# Patient Record
Sex: Female | Born: 1945 | Race: White | Hispanic: No | Marital: Married | Smoking: Never smoker
Health system: Southern US, Community
[De-identification: ages and names within clinical notes are randomized; demographics above are authoritative.]

## PROBLEM LIST (undated history)

## (undated) DIAGNOSIS — I10 Essential (primary) hypertension: Secondary | ICD-10-CM

## (undated) HISTORY — DX: Essential (primary) hypertension: I10

---

## 1998-08-19 ENCOUNTER — Ambulatory Visit (HOSPITAL_BASED_OUTPATIENT_CLINIC_OR_DEPARTMENT_OTHER): Admission: RE | Admit: 1998-08-19 | Discharge: 1998-08-19 | Payer: Self-pay | Admitting: General Surgery

## 2004-11-03 ENCOUNTER — Other Ambulatory Visit: Admission: RE | Admit: 2004-11-03 | Discharge: 2004-11-03 | Payer: Self-pay | Admitting: *Deleted

## 2005-12-06 ENCOUNTER — Other Ambulatory Visit: Admission: RE | Admit: 2005-12-06 | Discharge: 2005-12-06 | Payer: Self-pay | Admitting: *Deleted

## 2006-12-14 ENCOUNTER — Other Ambulatory Visit: Admission: RE | Admit: 2006-12-14 | Discharge: 2006-12-14 | Payer: Self-pay | Admitting: Family Medicine

## 2008-07-21 ENCOUNTER — Other Ambulatory Visit: Admission: RE | Admit: 2008-07-21 | Discharge: 2008-07-21 | Payer: Self-pay | Admitting: Family Medicine

## 2009-09-09 ENCOUNTER — Other Ambulatory Visit: Admission: RE | Admit: 2009-09-09 | Discharge: 2009-09-09 | Payer: Self-pay | Admitting: Family Medicine

## 2010-09-12 ENCOUNTER — Other Ambulatory Visit: Payer: Self-pay | Admitting: Family Medicine

## 2010-09-12 ENCOUNTER — Other Ambulatory Visit (HOSPITAL_COMMUNITY)
Admission: RE | Admit: 2010-09-12 | Discharge: 2010-09-12 | Disposition: A | Discharge status: Home / Self Care | Payer: Medicare Other | Source: Ambulatory Visit | Attending: Family Medicine | Admitting: Family Medicine

## 2010-09-12 DIAGNOSIS — Z124 Encounter for screening for malignant neoplasm of cervix: Secondary | ICD-10-CM | POA: Insufficient documentation

## 2012-09-30 ENCOUNTER — Other Ambulatory Visit: Payer: Self-pay | Admitting: Family Medicine

## 2012-09-30 DIAGNOSIS — R109 Unspecified abdominal pain: Secondary | ICD-10-CM

## 2012-10-03 ENCOUNTER — Ambulatory Visit
Admission: RE | Admit: 2012-10-03 | Discharge: 2012-10-03 | Disposition: A | Discharge status: Home / Self Care | Payer: Medicare PPO | Source: Ambulatory Visit | Attending: Family Medicine | Admitting: Family Medicine

## 2012-10-03 DIAGNOSIS — R109 Unspecified abdominal pain: Secondary | ICD-10-CM

## 2013-10-06 ENCOUNTER — Encounter: Payer: Self-pay | Admitting: Podiatry

## 2013-10-06 ENCOUNTER — Ambulatory Visit (INDEPENDENT_AMBULATORY_CARE_PROVIDER_SITE_OTHER): Payer: Medicare PPO

## 2013-10-06 ENCOUNTER — Ambulatory Visit (INDEPENDENT_AMBULATORY_CARE_PROVIDER_SITE_OTHER): Payer: Medicare PPO | Admitting: Podiatry

## 2013-10-06 VITALS — BP 120/67 | HR 76 | Resp 18

## 2013-10-06 DIAGNOSIS — L923 Foreign body granuloma of the skin and subcutaneous tissue: Secondary | ICD-10-CM

## 2013-10-06 DIAGNOSIS — M722 Plantar fascial fibromatosis: Secondary | ICD-10-CM

## 2013-10-06 DIAGNOSIS — R52 Pain, unspecified: Secondary | ICD-10-CM

## 2013-10-06 MED ORDER — TRIAMCINOLONE ACETONIDE 10 MG/ML IJ SUSP
10.0000 mg | Freq: Once | INTRAMUSCULAR | Status: AC
Start: 2013-10-06 — End: 2013-10-06
  Administered 2013-10-06: 10 mg

## 2013-10-06 NOTE — Progress Notes (Signed)
° °  Subjective:    Patient ID: Kristen Johnson, female    DOB: 09/13/1945, 68 y.o.   MRN: [id number]  HPI about five weeks ago I stepped on a shoe tray and started hurting and shoots pain at night and maybe I bruised the muscle and does hurt with shoes and hurts from the instep to the top and stepped on some glass that my husband dropped on the right big toe and that happened about 3 weeks    Review of Systems  Allergic/Immunologic: Positive for environmental allergies.  All other systems reviewed and are negative.      Objective:   Physical Exam        Assessment & Plan:

## 2013-10-06 NOTE — Progress Notes (Signed)
Subjective:     Patient ID: Kristen Johnson, female   DOB: 04/23/1946, 68 y.o.   MRN: [id number]  HPI patient presents stating I stepped on a rate about 6 weeks ago and him having a lot of pain in the bottom of my arch and I'm also getting some changes around the big toe right might have stepped on something I'm not sure   Review of Systems  All other systems reviewed and are negative.      Objective:   Physical Exam  Nursing note and vitals reviewed. Constitutional: She is oriented to person, place, and time.  Cardiovascular: Intact distal pulses.   Musculoskeletal: Normal range of motion.  Neurological: She is oriented to person, place, and time.  Skin: Skin is warm.   neurovascular status intact with muscle strength adequate in range of motion the subtalar midtarsal joint within normal limits. Patient is found to have normal capsule time the digits and arch height and I noted the distal plantar fascia is inflamed around the insertion into the first metatarsal and there is a very slight area discoloration the right big toe with no proximal edema erythema or drainage noted     Assessment:     Probable traumatic plantar fasciitis right and possibility for foreign body right big toe    Plan:     H&P and x-rays reviewed today I injected the right plantar fascia 3 mg Kenalog 5 mg like Marcaine mixture and advised that if any changes should occur the big toe to let us know immediately. Reappoint 2 weeks earlier if necessary

## 2013-10-06 NOTE — Patient Instructions (Signed)

## 2014-09-26 DIAGNOSIS — J209 Acute bronchitis, unspecified: Secondary | ICD-10-CM | POA: Diagnosis not present

## 2014-10-14 DIAGNOSIS — H2513 Age-related nuclear cataract, bilateral: Secondary | ICD-10-CM | POA: Diagnosis not present

## 2014-10-14 DIAGNOSIS — H25041 Posterior subcapsular polar age-related cataract, right eye: Secondary | ICD-10-CM | POA: Diagnosis not present

## 2014-10-14 DIAGNOSIS — H43813 Vitreous degeneration, bilateral: Secondary | ICD-10-CM | POA: Diagnosis not present

## 2014-10-27 DIAGNOSIS — E2839 Other primary ovarian failure: Secondary | ICD-10-CM | POA: Diagnosis not present

## 2015-06-11 DIAGNOSIS — Z803 Family history of malignant neoplasm of breast: Secondary | ICD-10-CM | POA: Diagnosis not present

## 2015-06-11 DIAGNOSIS — Z1231 Encounter for screening mammogram for malignant neoplasm of breast: Secondary | ICD-10-CM | POA: Diagnosis not present

## 2017-12-14 ENCOUNTER — Encounter: Payer: Self-pay | Admitting: Podiatry

## 2017-12-14 ENCOUNTER — Ambulatory Visit (INDEPENDENT_AMBULATORY_CARE_PROVIDER_SITE_OTHER): Payer: Medicare Other | Admitting: Podiatry

## 2017-12-14 ENCOUNTER — Ambulatory Visit (INDEPENDENT_AMBULATORY_CARE_PROVIDER_SITE_OTHER): Payer: Medicare Other

## 2017-12-14 VITALS — BP 114/73 | HR 75 | Resp 16

## 2017-12-14 DIAGNOSIS — S9031XA Contusion of right foot, initial encounter: Secondary | ICD-10-CM | POA: Diagnosis not present

## 2017-12-14 DIAGNOSIS — S92501A Displaced unspecified fracture of right lesser toe(s), initial encounter for closed fracture: Secondary | ICD-10-CM

## 2017-12-14 NOTE — Progress Notes (Signed)
Subjective:   Patient ID: Kristen Johnson, female   DOB: 72 y.o.   MRN: [id number]   HPI Patient presents stating that she hit her foot on a piece of wood furniture and is been very sore and she is worried she broke something   ROS      Objective:  Physical Exam  Neurovascular status intact with patient's right foot showing swelling around the fourth digit and fourth metatarsal with pain within the digit itself     Assessment:  Probability for fracture of the fourth digit right with inflammatory process     Plan:  H&P condition reviewed and discussed fracture and healing.  This should heal uneventfully but I want her to use ice wider shoes and not wear anything constrictive.  Reappoint to recheck  X-ray indicates that the patient has a fracture of the base of the fourth digit right without displacement

## 2019-08-03 ENCOUNTER — Ambulatory Visit: Payer: Medicare PPO | Attending: Internal Medicine

## 2019-08-03 DIAGNOSIS — Z23 Encounter for immunization: Secondary | ICD-10-CM | POA: Insufficient documentation

## 2019-08-03 NOTE — Progress Notes (Signed)
   Covid-19 Vaccination Clinic  Name:  SAKIYAH SHUR    MRN: [id number] DOB: 1945-07-05  08/03/2019  Ms. Mcginnity was observed post Covid-19 immunization for 15 minutes without incidence. She was provided with Vaccine Information Sheet and instruction to access the V-Safe system.   Ms. Reifschneider was instructed to call 911 with any severe reactions post vaccine: Marland Kitchen Difficulty breathing  . Swelling of your face and throat  . A fast heartbeat  . A bad rash all over your body  . Dizziness and weakness    Immunizations Administered    Name Date Dose VIS Date Route   Pfizer COVID-19 Vaccine 08/03/2019  2:49 AM 0.3 mL 06/06/2019 Intramuscular   Manufacturer: [employer], [employer]   Lot: [id number]   NDC: [address]-7

## 2019-08-28 ENCOUNTER — Ambulatory Visit: Payer: Medicare PPO | Attending: Internal Medicine

## 2019-08-28 DIAGNOSIS — Z23 Encounter for immunization: Secondary | ICD-10-CM | POA: Insufficient documentation

## 2019-08-28 NOTE — Progress Notes (Signed)
   Covid-19 Vaccination Clinic  Name:  DAVANEE KLINKNER    MRN: [id number] DOB: [birth year]  08/28/2019  Ms. Cude was observed post Covid-19 immunization for 15 minutes without incident. She was provided with Vaccine Information Sheet and instruction to access the V-Safe system.   Ms. Mccollom was instructed to call 911 with any severe reactions post vaccine: Marland Kitchen Difficulty breathing  . Swelling of face and throat  . A fast heartbeat  . A bad rash all over body  . Dizziness and weakness   Immunizations Administered    Name Date Dose VIS Date Route   Pfizer COVID-19 Vaccine 08/28/2019 12:01 PM 0.3 mL 06/06/2019 Intramuscular   Manufacturer: [employer], [employer]   Lot: [id number]   NDC: [address]-4

## 2019-11-17 ENCOUNTER — Other Ambulatory Visit: Payer: Self-pay

## 2019-11-17 ENCOUNTER — Ambulatory Visit (INDEPENDENT_AMBULATORY_CARE_PROVIDER_SITE_OTHER): Payer: Medicare PPO

## 2019-11-17 ENCOUNTER — Ambulatory Visit (INDEPENDENT_AMBULATORY_CARE_PROVIDER_SITE_OTHER): Payer: Medicare PPO | Admitting: Orthopaedic Surgery

## 2019-11-17 VITALS — Ht 62.0 in | Wt 190.0 lb

## 2019-11-17 DIAGNOSIS — M4807 Spinal stenosis, lumbosacral region: Secondary | ICD-10-CM

## 2019-11-17 DIAGNOSIS — M79604 Pain in right leg: Secondary | ICD-10-CM

## 2019-11-17 NOTE — Progress Notes (Signed)
Office Visit Note   Patient: Kristen Johnson           Date of Birth: 13-Dec-1945           MRN: [id number] Visit Date: 11/17/2019              Requested by: Clayborn Heron, MD [address] [place],  [place] [address] PCP: Clayborn Heron, MD   Assessment & Plan: Visit Diagnoses:  1. Pain in right leg     Plan: The prior orthopedic office never MRI her lumbar spine.  I believe that is reasonable at this point given the failed conservative treatment and this can help target were best to try an intervention such as a steroid injection or even at some point radiofrequency ablation.  She agrees with this treatment plan.  I will have her try Voltaren gel in the interim while we await the results of the MRI of her lumbar spine.  All questions and concerns were answered and addressed.  Follow-Up Instructions: Return in about 2 weeks (around 12/01/2019).   Orders:  Orders Placed This Encounter  Procedures  . XR HIP UNILAT W OR W/O PELVIS 1V RIGHT  . XR Lumbar Spine 2-3 Views   No orders of the defined types were placed in this encounter.     Procedures: No procedures performed   Clinical Data: No additional findings.   Subjective: Chief Complaint  Patient presents with  . Right Hip - Pain  The patient is someone I am seeing for the first time but she has had an extensive orthopedic work-up before.  She comes in with right-sided low back pain and hip pain.  She denies any pain in the groin.  She denies any radicular symptoms going down her leg.  She is apparently had an intervention before and her spine in terms of an injection in her hip.  She was told she had hip bursitis and she has spine issues.  She does state that they did an intra-articular steroid injection of her right hip but they did this as a mistake and let her know that.  She has lost trust in that group because of this.  Her pain is daily and it affects her mainly at night.  She has trouble getting good rest  at night due to pain in her lower lumbar spine to the right side.  She is someone with a BMI of 35.  HPI  Review of Systems She currently denies any headache, chest pain, shortness of breath, fever, chills, nausea, vomiting  Objective: Vital Signs: Ht 5\' 2"  (1.575 m)   Wt 190 lb (86.2 kg)   BMI 34.75 kg/m   Physical Exam She is alert and orient x3 and in no acute distress Ortho Exam Examination of both her hips shows fluid and full range of motion of both hips with no pain in the groin at all.  She does have some slight pain to palpation over the right trochanteric area.  Examination of her lumbar spine shows severe pain in the paraspinal muscles and around the facet joints mainly to the right side at the lower lumbar spine.  She has pretty good flexion extension of her lumbar spine but is painful with extension. Specialty Comments:  No specialty comments available.  Imaging: XR HIP UNILAT W OR W/O PELVIS 1V RIGHT  Result Date: 11/17/2019 An AP pelvis and lateral the right hip shows normal-appearing hip joints bilaterally.  There are no acute findings.  The joint space is well-maintained with no significant arthritic findings at all.  XR Lumbar Spine 2-3 Views  Result Date: 11/17/2019 2 views of the lumbar spine show no acute findings.  There is a grade 1 anterolisthesis of L4 and L5.  There are arthritic changes at several levels.    PMFS History: There are no problems to display for this patient.  Past Medical History:  Diagnosis Date  . Hypertension     No family history on file.  No past surgical history on file. Social History   Occupational History  . Not on file  Tobacco Use  . Smoking status: Never Smoker  . Smokeless tobacco: Never Used  Substance and Sexual Activity  . Alcohol use: No  . Drug use: No  . Sexual activity: Not on file

## 2019-11-18 ENCOUNTER — Encounter: Payer: Self-pay | Admitting: Orthopaedic Surgery

## 2019-12-03 ENCOUNTER — Ambulatory Visit: Payer: Medicare PPO | Admitting: Orthopaedic Surgery

## 2019-12-10 DIAGNOSIS — Z1231 Encounter for screening mammogram for malignant neoplasm of breast: Secondary | ICD-10-CM | POA: Diagnosis not present

## 2019-12-15 ENCOUNTER — Ambulatory Visit
Admission: RE | Admit: 2019-12-15 | Discharge: 2019-12-15 | Disposition: A | Discharge status: Home / Self Care | Payer: Medicare PPO | Source: Ambulatory Visit | Attending: Orthopaedic Surgery | Admitting: Orthopaedic Surgery

## 2019-12-15 DIAGNOSIS — M4807 Spinal stenosis, lumbosacral region: Secondary | ICD-10-CM

## 2019-12-15 DIAGNOSIS — M48061 Spinal stenosis, lumbar region without neurogenic claudication: Secondary | ICD-10-CM | POA: Diagnosis not present

## 2019-12-17 ENCOUNTER — Other Ambulatory Visit: Payer: Self-pay

## 2019-12-17 ENCOUNTER — Ambulatory Visit (INDEPENDENT_AMBULATORY_CARE_PROVIDER_SITE_OTHER): Payer: Medicare PPO | Admitting: Physician Assistant

## 2019-12-17 ENCOUNTER — Encounter: Payer: Self-pay | Admitting: Orthopaedic Surgery

## 2019-12-17 DIAGNOSIS — M4807 Spinal stenosis, lumbosacral region: Secondary | ICD-10-CM | POA: Diagnosis not present

## 2019-12-17 NOTE — Progress Notes (Signed)
   Office Visit Note   Patient: Kristen Johnson           Date of Birth: Aug 12, 1945           MRN: [id number] Visit Date: 12/17/2019              Requested by: Clayborn Heron, MD [address] [place],  [place] [address] PCP: Clayborn Heron, MD   Assessment & Plan: Visit Diagnoses:  1. Spinal stenosis of lumbosacral region     Plan: We will refer her to Dr. Alvester Morin for lumbar epidural steroid injection. See her back 4 weeks after the  injection to see what type of response she had. Questions were encouraged and answered.  Follow-Up Instructions: Return 4weeks post ESI.   Orders:  No orders of the defined types were placed in this encounter.  No orders of the defined types were placed in this encounter.     Procedures: No procedures performed   Clinical Data: No additional findings.   Subjective: Chief Complaint  Patient presents with  . Lower Back - Follow-up    HPI [name] returns today follow-up of her MRI. She continues to have low back pain particularly on the right side. Occasional achy pain lower lateral left leg. She denies any saddle anesthesia bowel or bladder dysfunction. Does have awakening pain when she rolls over on the right side. MRI images are reviewed with the patient the lumbar spine model was also used for visualization purposes. MRI dated 12/16/2019 showed multifactorial L3-4 spinal stenosis which is moderate. Also at L3-4 encroachment upon the right L3 nerve root due to the broad-based foraminal disc protrusion. Mild multifactorial stenosis at L4-5. Foraminal stenosis at L2-3 mild. Moderate facet hypertrophy at L3-4 and L4-5.  Review of Systems See HPI   Objective: Vital Signs: There were no vitals taken for this visit.  Physical Exam Constitutional:      Appearance: She is not ill-appearing or diaphoretic.  Pulmonary:     Effort: Pulmonary effort is normal.  Neurological:     Mental Status: She is alert and oriented to person,  place, and time.  Psychiatric:        Mood and Affect: Mood normal.     Ortho Exam negative straight leg raise bilaterally. Good range of motion both hips. Minimal tenderness over the right trochanteric region.  Specialty Comments:  No specialty comments available.  Imaging: No results found.   PMFS History: There are no problems to display for this patient.  Past Medical History:  Diagnosis Date  . Hypertension     No family history on file.  No past surgical history on file. Social History   Occupational History  . Not on file  Tobacco Use  . Smoking status: Never Smoker  . Smokeless tobacco: Never Used  Substance and Sexual Activity  . Alcohol use: No  . Drug use: No  . Sexual activity: Not on file

## 2019-12-24 ENCOUNTER — Other Ambulatory Visit: Payer: Self-pay | Admitting: Radiology

## 2019-12-24 DIAGNOSIS — M79604 Pain in right leg: Secondary | ICD-10-CM

## 2019-12-24 DIAGNOSIS — M4807 Spinal stenosis, lumbosacral region: Secondary | ICD-10-CM

## 2019-12-25 ENCOUNTER — Encounter: Payer: Self-pay | Admitting: Physical Medicine and Rehabilitation

## 2020-01-01 DIAGNOSIS — L719 Rosacea, unspecified: Secondary | ICD-10-CM | POA: Diagnosis not present

## 2020-01-01 DIAGNOSIS — L578 Other skin changes due to chronic exposure to nonionizing radiation: Secondary | ICD-10-CM | POA: Diagnosis not present

## 2020-01-01 DIAGNOSIS — L821 Other seborrheic keratosis: Secondary | ICD-10-CM | POA: Diagnosis not present

## 2020-01-01 DIAGNOSIS — D235 Other benign neoplasm of skin of trunk: Secondary | ICD-10-CM | POA: Diagnosis not present

## 2020-01-01 DIAGNOSIS — L905 Scar conditions and fibrosis of skin: Secondary | ICD-10-CM | POA: Diagnosis not present

## 2020-01-01 DIAGNOSIS — L814 Other melanin hyperpigmentation: Secondary | ICD-10-CM | POA: Diagnosis not present

## 2020-01-01 DIAGNOSIS — L570 Actinic keratosis: Secondary | ICD-10-CM | POA: Diagnosis not present

## 2020-01-01 DIAGNOSIS — D1801 Hemangioma of skin and subcutaneous tissue: Secondary | ICD-10-CM | POA: Diagnosis not present

## 2020-01-12 ENCOUNTER — Encounter: Payer: Self-pay | Admitting: Physical Medicine and Rehabilitation

## 2020-01-12 ENCOUNTER — Ambulatory Visit (INDEPENDENT_AMBULATORY_CARE_PROVIDER_SITE_OTHER): Payer: Medicare PPO | Admitting: Physical Medicine and Rehabilitation

## 2020-01-12 ENCOUNTER — Other Ambulatory Visit: Payer: Self-pay

## 2020-01-12 ENCOUNTER — Ambulatory Visit: Payer: Self-pay

## 2020-01-12 VITALS — BP 118/71 | HR 89

## 2020-01-12 DIAGNOSIS — M5416 Radiculopathy, lumbar region: Secondary | ICD-10-CM

## 2020-01-12 DIAGNOSIS — M48062 Spinal stenosis, lumbar region with neurogenic claudication: Secondary | ICD-10-CM

## 2020-01-12 MED ORDER — METHYLPREDNISOLONE ACETATE 80 MG/ML IJ SUSP
80.0000 mg | Freq: Once | INTRAMUSCULAR | Status: AC
Start: 2020-01-12 — End: 2020-01-12
  Administered 2020-01-12: 80 mg

## 2020-01-12 NOTE — Progress Notes (Signed)
Pt states right lower back pain. Pt states at night when trying to sleep and activity makes thr pain worse. Pt state pain meds makes it feel better.   Numeric Pain Rating Scale and Functional Assessment Average Pain 5   In the last MONTH (on 0-10 scale) has pain interfered with the following?  1. General activity like being  able to carry out your everyday physical activities such as walking, climbing stairs, carrying groceries, or moving a chair?  Rating(7)   +Driver, -BT, -Dye Allergies.

## 2020-01-22 ENCOUNTER — Ambulatory Visit: Payer: Medicare PPO | Admitting: Orthopaedic Surgery

## 2020-01-26 ENCOUNTER — Encounter: Payer: Medicare PPO | Admitting: Physical Medicine and Rehabilitation

## 2020-02-09 ENCOUNTER — Ambulatory Visit (INDEPENDENT_AMBULATORY_CARE_PROVIDER_SITE_OTHER): Payer: Medicare PPO | Admitting: Orthopaedic Surgery

## 2020-02-09 ENCOUNTER — Encounter: Payer: Self-pay | Admitting: Orthopaedic Surgery

## 2020-02-09 DIAGNOSIS — M4807 Spinal stenosis, lumbosacral region: Secondary | ICD-10-CM

## 2020-02-09 DIAGNOSIS — M79604 Pain in right leg: Secondary | ICD-10-CM | POA: Diagnosis not present

## 2020-02-09 NOTE — Progress Notes (Signed)
Kristen Johnson - 74 y.o. female MRN [id number]  Date of birth: 03-20-1946  Office Visit Note: Visit Date: 01/12/2020 PCP: Clayborn Heron, MD Referred by: Clayborn Heron, MD  Subjective: Chief Complaint  Patient presents with  . Lower Back - Pain   HPI:  Kristen Johnson is a 74 y.o. female who comes in today at the request of Rexene Edison, PA-C for planned Bilateral L3-L4 Lumbar epidural steroid injection with fluoroscopic guidance.  The patient has failed conservative care including home exercise, medications, time and activity modification.  This injection will be diagnostic and hopefully therapeutic.  Please see requesting physician notes for further details and justification.  MRI reviewed with images and spine model.  MRI reviewed in the note below.    ROS Otherwise per HPI.  Assessment & Plan: Visit Diagnoses:  1. Lumbar radiculopathy   2. Spinal stenosis of lumbar region with neurogenic claudication     Plan: No additional findings.   Meds & Orders:  Meds ordered this encounter  Medications  . methylPREDNISolone acetate (DEPO-MEDROL) injection 80 mg    Orders Placed This Encounter  Procedures  . XR C-ARM NO REPORT  . Epidural Steroid injection    Follow-up: Return for visit to requesting physician as needed.   Procedures: No procedures performed  Lumbosacral Transforaminal Epidural Steroid Injection - Sub-Pedicular Approach with Fluoroscopic Guidance  Patient: Kristen Johnson      Date of Birth: 08-01-1945 MRN: [id number] PCP: Clayborn Heron, MD      Visit Date: 01/12/2020   Universal Protocol:    Date/Time: 01/12/2020  Consent Given By: the patient  Position: PRONE  Additional Comments: Vital signs were monitored before and after the procedure. Patient was prepped and draped in the usual sterile fashion. The correct patient, procedure, and site was verified.   Injection Procedure Details:  Procedure Site One Meds Administered:  Meds ordered  this encounter  Medications  . methylPREDNISolone acetate (DEPO-MEDROL) injection 80 mg    Laterality: Bilateral  Location/Site:  L3-L4  Needle size: 22 G  Needle type: Spinal  Needle Placement: Transforaminal  Findings:    -Comments: Excellent flow of contrast along the nerve, nerve root and into the epidural space.  Procedure Details: After squaring off the end-plates to get a true AP view, the C-arm was positioned so that an oblique view of the foramen as noted above was visualized. The target area is just inferior to the "nose of the scotty dog" or sub pedicular. The soft tissues overlying this structure were infiltrated with 2-3 ml. of 1% Lidocaine without Epinephrine.  The spinal needle was inserted toward the target using a "trajectory" view along the fluoroscope beam.  Under AP and lateral visualization, the needle was advanced so it did not puncture dura and was located close the 6 O'Clock position of the pedical in AP tracterory. Biplanar projections were used to confirm position. Aspiration was confirmed to be negative for CSF and/or blood. A 1-2 ml. volume of Isovue-250 was injected and flow of contrast was noted at each level. Radiographs were obtained for documentation purposes.   After attaining the desired flow of contrast documented above, a 0.5 to 1.0 ml test dose of 0.25% Marcaine was injected into each respective transforaminal space.  The patient was observed for 90 seconds post injection.  After no sensory deficits were reported, and normal lower extremity motor function was noted,   the above injectate was administered so that equal amounts of the injectate  were placed at each foramen (level) into the transforaminal epidural space.   Additional Comments:  The patient tolerated the procedure well Dressing: 2 x 2 sterile gauze and Band-Aid    Post-procedure details: Patient was observed during the procedure. Post-procedure instructions were reviewed.  Patient  left the clinic in stable condition.      Clinical History: MRI LUMBAR SPINE WITHOUT CONTRAST  TECHNIQUE: Multiplanar, multisequence MR imaging of the lumbar spine was performed. No intravenous contrast was administered.  COMPARISON:  Radiographs 11/17/2019.  FINDINGS: Segmentation: Conventional anatomy assumed, with the last open disc space designated L5-S1.Concordant with prior radiographs.  Alignment: Degenerative grade 1 anterolisthesis at L4-5 and a mild convex left scoliosis.  Vertebrae: No worrisome osseous lesion, acute fracture or pars defect. Prominent endplate degenerative changes at L3-4, asymmetric to the right. The visualized sacroiliac joints appear unremarkable.  Conus medullaris: Extends to the L1-2 level and appears normal.  Paraspinal and other soft tissues: No significant paraspinal findings.  Disc levels:  Sagittal images demonstrate mild disc bulging at T11-12 without cord deformity. The left foramen appears mildly narrowed.  Disc height and hydration are maintained at T12-L1 and L1-2.  L2-3: Mild disc bulging, facet and ligamentous hypertrophy. No significant resulting central canal stenosis. Mild narrowing of the left lateral recess and both foramina.  L3-4: Loss of disc height with annular disc bulging and a broad-based right foraminal disc protrusion. Moderate facet and ligamentous hypertrophy. These factors contribute to moderate spinal stenosis with narrowing of both lateral recesses. In addition, there is right greater than left foraminal narrowing with probable right L3 nerve root encroachment.  L4-5: Mild loss of disc height with disc bulging and moderate facet hypertrophy. Resulting mild spinal stenosis with mild narrowing of the lateral recesses and foramina.  L5-S1: Disc height and hydration are maintained. Mild bilateral facet hypertrophy without resulting spinal stenosis or nerve  root encroachment.  IMPRESSION: 1. Moderate multifactorial spinal stenosis at L3-4 with narrowing of both lateral recesses and right greater than left foraminal narrowing. There is probable right L3 nerve root encroachment by a broad-based foraminal disc protrusion. 2. Mild multifactorial spinal stenosis at L4-5 with mild narrowing of the lateral recesses and foramina. 3. Mild narrowing of the left lateral recess and both foramina at L2-3.   Electronically Signed   By: Carey Bullocks M.D.   On: 12/16/2019 15:18     Objective:  VS:  HT:    WT:   BMI:     BP:118/71  HR:89bpm  TEMP: ( )  RESP:  Physical Exam Constitutional:      General: She is not in acute distress.    Appearance: Normal appearance. She is not ill-appearing.  HENT:     Head: Normocephalic and atraumatic.     Right Ear: External ear normal.     Left Ear: External ear normal.  Eyes:     Extraocular Movements: Extraocular movements intact.  Cardiovascular:     Rate and Rhythm: Normal rate.     Pulses: Normal pulses.  Musculoskeletal:     Right lower leg: No edema.     Left lower leg: No edema.     Comments: Patient has good distal strength with no pain over the greater trochanters.  No clonus or focal weakness.  Skin:    Findings: No erythema, lesion or rash.  Neurological:     General: No focal deficit present.     Mental Status: She is alert and oriented to person, place, and time.  Sensory: No sensory deficit.     Motor: No weakness or abnormal muscle tone.     Coordination: Coordination normal.  Psychiatric:        Mood and Affect: Mood normal.        Behavior: Behavior normal.      Imaging: No results found.

## 2020-02-09 NOTE — Progress Notes (Signed)
Patient comes in for follow-up after having bilateral L3-L4 epidural steroid injections by Dr. Alvester Morin.  This was done about 4 weeks ago.  She says she feels about 85% better.  She says anti-inflammatories and stretching is helped quite a bit.  She is through with the major pain she states.  She is ambulating without assistive device.  She looks comfortable overall.  She has negative straight leg raise bilaterally with excellent strength in her lower extremities and no significant radicular symptoms.  At this point follow-up can be as needed.  Certainly if she has a breakthrough of pain she can always give [place] a call to be set up for injections again.  All questions and concerns were answered and addressed.

## 2020-02-09 NOTE — Procedures (Signed)
Lumbosacral Transforaminal Epidural Steroid Injection - Sub-Pedicular Approach with Fluoroscopic Guidance  Patient: Kristen Johnson      Date of Birth: Aug 23, 1945 MRN: [id number] PCP: Clayborn Heron, MD      Visit Date: 01/12/2020   Universal Protocol:    Date/Time: 01/12/2020  Consent Given By: the patient  Position: PRONE  Additional Comments: Vital signs were monitored before and after the procedure. Patient was prepped and draped in the usual sterile fashion. The correct patient, procedure, and site was verified.   Injection Procedure Details:  Procedure Site One Meds Administered:  Meds ordered this encounter  Medications  . methylPREDNISolone acetate (DEPO-MEDROL) injection 80 mg    Laterality: Bilateral  Location/Site:  L3-L4  Needle size: 22 G  Needle type: Spinal  Needle Placement: Transforaminal  Findings:    -Comments: Excellent flow of contrast along the nerve, nerve root and into the epidural space.  Procedure Details: After squaring off the end-plates to get a true AP view, the C-arm was positioned so that an oblique view of the foramen as noted above was visualized. The target area is just inferior to the "nose of the scotty dog" or sub pedicular. The soft tissues overlying this structure were infiltrated with 2-3 ml. of 1% Lidocaine without Epinephrine.  The spinal needle was inserted toward the target using a "trajectory" view along the fluoroscope beam.  Under AP and lateral visualization, the needle was advanced so it did not puncture dura and was located close the 6 O'Clock position of the pedical in AP tracterory. Biplanar projections were used to confirm position. Aspiration was confirmed to be negative for CSF and/or blood. A 1-2 ml. volume of Isovue-250 was injected and flow of contrast was noted at each level. Radiographs were obtained for documentation purposes.   After attaining the desired flow of contrast documented above, a 0.5 to 1.0 ml  test dose of 0.25% Marcaine was injected into each respective transforaminal space.  The patient was observed for 90 seconds post injection.  After no sensory deficits were reported, and normal lower extremity motor function was noted,   the above injectate was administered so that equal amounts of the injectate were placed at each foramen (level) into the transforaminal epidural space.   Additional Comments:  The patient tolerated the procedure well Dressing: 2 x 2 sterile gauze and Band-Aid    Post-procedure details: Patient was observed during the procedure. Post-procedure instructions were reviewed.  Patient left the clinic in stable condition.

## 2020-02-17 ENCOUNTER — Encounter: Payer: Self-pay | Admitting: Physical Medicine and Rehabilitation

## 2020-03-15 DIAGNOSIS — R0981 Nasal congestion: Secondary | ICD-10-CM | POA: Diagnosis not present

## 2020-03-16 DIAGNOSIS — R0981 Nasal congestion: Secondary | ICD-10-CM | POA: Diagnosis not present

## 2020-04-10 ENCOUNTER — Ambulatory Visit: Payer: Medicare PPO | Attending: Internal Medicine

## 2020-04-10 DIAGNOSIS — Z23 Encounter for immunization: Secondary | ICD-10-CM

## 2020-04-10 NOTE — Progress Notes (Signed)
   Covid-19 Vaccination Clinic  Name:  Kristen Johnson    MRN: [id number] DOB: 07-14-1945  04/10/2020  Kristen Johnson was observed post Covid-19 immunization for 15 minutes without incident. She was provided with Vaccine Information Sheet and instruction to access the V-Safe system.   Kristen Johnson was instructed to call 911 with any severe reactions post vaccine: Marland Kitchen Difficulty breathing  . Swelling of face and throat  . A fast heartbeat  . A bad rash all over body  . Dizziness and weakness

## 2020-04-30 DIAGNOSIS — Z23 Encounter for immunization: Secondary | ICD-10-CM | POA: Diagnosis not present

## 2020-05-07 DIAGNOSIS — Z0000 Encounter for general adult medical examination without abnormal findings: Secondary | ICD-10-CM | POA: Diagnosis not present

## 2020-05-07 DIAGNOSIS — Z1389 Encounter for screening for other disorder: Secondary | ICD-10-CM | POA: Diagnosis not present

## 2020-05-07 DIAGNOSIS — Z23 Encounter for immunization: Secondary | ICD-10-CM | POA: Diagnosis not present

## 2020-05-23 ENCOUNTER — Encounter: Payer: Self-pay | Admitting: Physical Medicine and Rehabilitation

## 2020-05-26 DIAGNOSIS — M8580 Other specified disorders of bone density and structure, unspecified site: Secondary | ICD-10-CM | POA: Diagnosis not present

## 2020-07-07 ENCOUNTER — Encounter: Payer: Self-pay | Admitting: Physical Medicine and Rehabilitation

## 2020-08-02 ENCOUNTER — Encounter: Payer: Self-pay | Admitting: Physical Medicine and Rehabilitation

## 2020-08-02 ENCOUNTER — Ambulatory Visit (INDEPENDENT_AMBULATORY_CARE_PROVIDER_SITE_OTHER): Payer: Medicare PPO | Admitting: Physical Medicine and Rehabilitation

## 2020-08-02 ENCOUNTER — Other Ambulatory Visit: Payer: Self-pay

## 2020-08-02 ENCOUNTER — Ambulatory Visit: Payer: Self-pay

## 2020-08-02 VITALS — BP 113/80 | HR 88

## 2020-08-02 DIAGNOSIS — M5416 Radiculopathy, lumbar region: Secondary | ICD-10-CM | POA: Diagnosis not present

## 2020-08-02 DIAGNOSIS — M48062 Spinal stenosis, lumbar region with neurogenic claudication: Secondary | ICD-10-CM

## 2020-08-02 MED ORDER — METHYLPREDNISOLONE ACETATE 80 MG/ML IJ SUSP
80.0000 mg | Freq: Once | INTRAMUSCULAR | Status: AC
  Administered 2020-08-02: 80 mg

## 2020-08-02 NOTE — Patient Instructions (Signed)

## 2020-08-02 NOTE — Progress Notes (Signed)
Pt state lower back pain, mostly on the right side. Pt state laying in bed at night and climbing stairs makes the pain worse. Pt state she take over the counter pain meds to help ease the pain. Pt has hx of inj on 01/12/20 pt state it worked well about 80% and lasted for a few months.  Numeric Pain Rating Scale and Functional Assessment Average Pain 7   In the last MONTH (on 0-10 scale) has pain interfered with the following?  1. General activity like being  able to carry out your everyday physical activities such as walking, climbing stairs, carrying groceries, or moving a chair?  Rating(8)   +Driver, -BT, -Dye Allergies.

## 2020-08-02 NOTE — Procedures (Signed)
Lumbosacral Transforaminal Epidural Steroid Injection - Sub-Pedicular Approach with Fluoroscopic Guidance  Patient: Kristen Johnson      Date of Birth: Nov 18, 1945 MRN: [id number] PCP: Clayborn Heron, MD      Visit Date: 08/02/2020   Universal Protocol:    Date/Time: 08/02/2020  Consent Given By: the patient  Position: PRONE  Additional Comments: Vital signs were monitored before and after the procedure. Patient was prepped and draped in the usual sterile fashion. The correct patient, procedure, and site was verified.   Injection Procedure Details:   Procedure diagnoses: Lumbar radiculopathy [M54.16]    Meds Administered:  Meds ordered this encounter  Medications  . methylPREDNISolone acetate (DEPO-MEDROL) injection 80 mg    Laterality: Bilateral  Location/Site:  L3-L4  Needle:5.0 in., 22 ga.  Short bevel or Quincke spinal needle  Needle Placement: Transforaminal  Findings:    -Comments: Excellent flow of contrast along the nerve, nerve root and into the epidural space.  Procedure Details: After squaring off the end-plates to get a true AP view, the C-arm was positioned so that an oblique view of the foramen as noted above was visualized. The target area is just inferior to the "nose of the scotty dog" or sub pedicular. The soft tissues overlying this structure were infiltrated with 2-3 ml. of 1% Lidocaine without Epinephrine.  The spinal needle was inserted toward the target using a "trajectory" view along the fluoroscope beam.  Under AP and lateral visualization, the needle was advanced so it did not puncture dura and was located close the 6 O'Clock position of the pedical in AP tracterory. Biplanar projections were used to confirm position. Aspiration was confirmed to be negative for CSF and/or blood. A 1-2 ml. volume of Isovue-250 was injected and flow of contrast was noted at each level. Radiographs were obtained for documentation purposes.   After attaining the  desired flow of contrast documented above, a 0.5 to 1.0 ml test dose of 0.25% Marcaine was injected into each respective transforaminal space.  The patient was observed for 90 seconds post injection.  After no sensory deficits were reported, and normal lower extremity motor function was noted,   the above injectate was administered so that equal amounts of the injectate were placed at each foramen (level) into the transforaminal epidural space.   Additional Comments:  The patient tolerated the procedure well Dressing: 2 x 2 sterile gauze and Band-Aid    Post-procedure details: Patient was observed during the procedure. Post-procedure instructions were reviewed.  Patient left the clinic in stable condition.

## 2020-08-02 NOTE — Progress Notes (Signed)
Kristen Johnson - 75 y.o. female MRN [id number]  Date of birth: [birth year]  Office Visit Note: Visit Date: 08/02/2020 PCP: Clayborn Heron, MD Referred by: Clayborn Heron, MD  Subjective: Chief Complaint  Patient presents with  . Lower Back - Pain   HPI:  Kristen Johnson is a 75 y.o. female who comes in today for planned repeat Bilateral L3-L4 Lumbar epidural steroid injection with fluoroscopic guidance.  The patient has failed conservative care including home exercise, medications, time and activity modification.  This injection will be diagnostic and hopefully therapeutic.  Please see requesting physician notes for further details and justification. Patient received more than 50% pain relief from prior injection.   Referring: Dr. Doneen Poisson    ROS Otherwise per HPI.  Assessment & Plan: Visit Diagnoses:    ICD-10-CM   1. Lumbar radiculopathy  M54.16 XR C-ARM NO REPORT    Epidural Steroid injection    methylPREDNISolone acetate (DEPO-MEDROL) injection 80 mg  2. Spinal stenosis of lumbar region with neurogenic claudication  M48.062 XR C-ARM NO REPORT    Epidural Steroid injection    methylPREDNISolone acetate (DEPO-MEDROL) injection 80 mg    Plan: No additional findings.   Meds & Orders:  Meds ordered this encounter  Medications  . methylPREDNISolone acetate (DEPO-MEDROL) injection 80 mg    Orders Placed This Encounter  Procedures  . XR C-ARM NO REPORT  . Epidural Steroid injection    Follow-up: Return if symptoms worsen or fail to improve.   Procedures: No procedures performed  Lumbosacral Transforaminal Epidural Steroid Injection - Sub-Pedicular Approach with Fluoroscopic Guidance  Patient: Kristen Johnson      Date of Birth: 09/09/1945 MRN: [id number] PCP: Clayborn Heron, MD      Visit Date: 08/02/2020   Universal Protocol:    Date/Time: 08/02/2020  Consent Given By: the patient  Position: PRONE  Additional Comments: Vital signs were  monitored before and after the procedure. Patient was prepped and draped in the usual sterile fashion. The correct patient, procedure, and site was verified.   Injection Procedure Details:   Procedure diagnoses: Lumbar radiculopathy [M54.16]    Meds Administered:  Meds ordered this encounter  Medications  . methylPREDNISolone acetate (DEPO-MEDROL) injection 80 mg    Laterality: Bilateral  Location/Site:  L3-L4  Needle:5.0 in., 22 ga.  Short bevel or Quincke spinal needle  Needle Placement: Transforaminal  Findings:    -Comments: Excellent flow of contrast along the nerve, nerve root and into the epidural space.  Procedure Details: After squaring off the end-plates to get a true AP view, the C-arm was positioned so that an oblique view of the foramen as noted above was visualized. The target area is just inferior to the "nose of the scotty dog" or sub pedicular. The soft tissues overlying this structure were infiltrated with 2-3 ml. of 1% Lidocaine without Epinephrine.  The spinal needle was inserted toward the target using a "trajectory" view along the fluoroscope beam.  Under AP and lateral visualization, the needle was advanced so it did not puncture dura and was located close the 6 O'Clock position of the pedical in AP tracterory. Biplanar projections were used to confirm position. Aspiration was confirmed to be negative for CSF and/or blood. A 1-2 ml. volume of Isovue-250 was injected and flow of contrast was noted at each level. Radiographs were obtained for documentation purposes.   After attaining the desired flow of contrast documented above, a 0.5 to 1.0 ml test dose  of 0.25% Marcaine was injected into each respective transforaminal space.  The patient was observed for 90 seconds post injection.  After no sensory deficits were reported, and normal lower extremity motor function was noted,   the above injectate was administered so that equal amounts of the injectate were  placed at each foramen (level) into the transforaminal epidural space.   Additional Comments:  The patient tolerated the procedure well Dressing: 2 x 2 sterile gauze and Band-Aid    Post-procedure details: Patient was observed during the procedure. Post-procedure instructions were reviewed.  Patient left the clinic in stable condition.      Clinical History: MRI LUMBAR SPINE WITHOUT CONTRAST  TECHNIQUE: Multiplanar, multisequence MR imaging of the lumbar spine was performed. No intravenous contrast was administered.  COMPARISON:  Radiographs 11/17/2019.  FINDINGS: Segmentation: Conventional anatomy assumed, with the last open disc space designated L5-S1.Concordant with prior radiographs.  Alignment: Degenerative grade 1 anterolisthesis at L4-5 and a mild convex left scoliosis.  Vertebrae: No worrisome osseous lesion, acute fracture or pars defect. Prominent endplate degenerative changes at L3-4, asymmetric to the right. The visualized sacroiliac joints appear unremarkable.  Conus medullaris: Extends to the L1-2 level and appears normal.  Paraspinal and other soft tissues: No significant paraspinal findings.  Disc levels:  Sagittal images demonstrate mild disc bulging at T11-12 without cord deformity. The left foramen appears mildly narrowed.  Disc height and hydration are maintained at T12-L1 and L1-2.  L2-3: Mild disc bulging, facet and ligamentous hypertrophy. No significant resulting central canal stenosis. Mild narrowing of the left lateral recess and both foramina.  L3-4: Loss of disc height with annular disc bulging and a broad-based right foraminal disc protrusion. Moderate facet and ligamentous hypertrophy. These factors contribute to moderate spinal stenosis with narrowing of both lateral recesses. In addition, there is right greater than left foraminal narrowing with probable right L3 nerve root encroachment.  L4-5: Mild loss of disc  height with disc bulging and moderate facet hypertrophy. Resulting mild spinal stenosis with mild narrowing of the lateral recesses and foramina.  L5-S1: Disc height and hydration are maintained. Mild bilateral facet hypertrophy without resulting spinal stenosis or nerve root encroachment.  IMPRESSION: 1. Moderate multifactorial spinal stenosis at L3-4 with narrowing of both lateral recesses and right greater than left foraminal narrowing. There is probable right L3 nerve root encroachment by a broad-based foraminal disc protrusion. 2. Mild multifactorial spinal stenosis at L4-5 with mild narrowing of the lateral recesses and foramina. 3. Mild narrowing of the left lateral recess and both foramina at L2-3.   Electronically Signed   By: Carey Bullocks M.D.   On: 12/16/2019 15:18     Objective:  VS:  HT:    WT:   BMI:     BP:113/80  HR:88bpm  TEMP: ( )  RESP:  Physical Exam Vitals and nursing note reviewed.  Constitutional:      General: She is not in acute distress.    Appearance: Normal appearance. She is obese. She is not ill-appearing.  HENT:     Head: Normocephalic and atraumatic.     Right Ear: External ear normal.     Left Ear: External ear normal.  Eyes:     Extraocular Movements: Extraocular movements intact.  Cardiovascular:     Rate and Rhythm: Normal rate.     Pulses: Normal pulses.  Pulmonary:     Effort: Pulmonary effort is normal. No respiratory distress.  Abdominal:     General: There is no distension.  Palpations: Abdomen is soft.  Musculoskeletal:        General: Tenderness present.     Cervical back: Neck supple.     Right lower leg: No edema.     Left lower leg: No edema.     Comments: Patient has good distal strength with no pain over the greater trochanters.  No clonus or focal weakness.  Skin:    Findings: No erythema, lesion or rash.  Neurological:     General: No focal deficit present.     Mental Status: She is alert and  oriented to person, place, and time.     Sensory: No sensory deficit.     Motor: No weakness or abnormal muscle tone.     Coordination: Coordination normal.  Psychiatric:        Mood and Affect: Mood normal.        Behavior: Behavior normal.      Imaging: No results found.

## 2020-09-01 DIAGNOSIS — E669 Obesity, unspecified: Secondary | ICD-10-CM | POA: Diagnosis not present

## 2020-09-01 DIAGNOSIS — I10 Essential (primary) hypertension: Secondary | ICD-10-CM | POA: Diagnosis not present

## 2020-09-01 DIAGNOSIS — M549 Dorsalgia, unspecified: Secondary | ICD-10-CM | POA: Diagnosis not present

## 2020-09-01 DIAGNOSIS — F411 Generalized anxiety disorder: Secondary | ICD-10-CM | POA: Diagnosis not present

## 2020-11-10 ENCOUNTER — Ambulatory Visit (INDEPENDENT_AMBULATORY_CARE_PROVIDER_SITE_OTHER): Payer: Medicare PPO | Admitting: Physician Assistant

## 2020-11-10 ENCOUNTER — Ambulatory Visit (INDEPENDENT_AMBULATORY_CARE_PROVIDER_SITE_OTHER): Payer: Medicare PPO

## 2020-11-10 ENCOUNTER — Encounter: Payer: Self-pay | Admitting: Physician Assistant

## 2020-11-10 DIAGNOSIS — M7062 Trochanteric bursitis, left hip: Secondary | ICD-10-CM | POA: Diagnosis not present

## 2020-11-10 MED ORDER — METHYLPREDNISOLONE ACETATE 40 MG/ML IJ SUSP
40.0000 mg | INTRAMUSCULAR | Status: AC | PRN
  Administered 2020-11-10: 40 mg via INTRA_ARTICULAR

## 2020-11-10 MED ORDER — LIDOCAINE HCL 1 % IJ SOLN
3.0000 mL | INTRAMUSCULAR | Status: AC | PRN
  Administered 2020-11-10: 3 mL

## 2020-11-10 MED ORDER — MELOXICAM 7.5 MG PO TABS: 7.5000 mg | ORAL_TABLET | Freq: Two times a day (BID) | ORAL | 1 refills | Status: DC

## 2020-11-10 NOTE — Progress Notes (Signed)
Office Visit Note   Patient: Kristen Johnson           Date of Birth: 10/10/1945           MRN: [id number] Visit Date: 11/10/2020              Requested by: Clayborn Heron, MD [address] [place],  [place] [address] PCP: Clayborn Heron, MD   Assessment & Plan: Visit Diagnoses:  1. Trochanteric bursitis of left hip     Plan: We will see how she does with a trochanteric injection.  She may be required another lumbar epidural steroid injection with Dr. Alvester Morin if this does not take away all of her pain or give her considerable relief.  Questions were encouraged and answered.  Follow-Up Instructions: Return if symptoms worsen or fail to improve.   Orders:  Orders Placed This Encounter  Procedures  . Large Joint Inj: L greater trochanter  . XR HIP UNILAT W OR W/O PELVIS 1V LEFT  . XR Lumbar Spine 2-3 Views   Meds ordered this encounter  Medications  . meloxicam (MOBIC) 7.5 MG tablet    Sig: Take 1 tablet (7.5 mg total) by mouth in the morning and at bedtime.    Dispense:  60 tablet    Refill:  1      Procedures: Large Joint Inj: L greater trochanter on 11/10/2020 1:20 PM Indications: pain Details: 22 G 1.5 in needle, lateral approach  Arthrogram: No  Medications: 3 mL lidocaine 1 %; 40 mg methylPREDNISolone acetate 40 MG/ML Outcome: tolerated well, no immediate complications Procedure, treatment alternatives, risks and benefits explained, specific risks discussed. Consent was given by the patient. Immediately prior to procedure a time out was called to verify the correct patient, procedure, equipment, support staff and site/side marked as required. Patient was prepped and draped in the usual sterile fashion.       Clinical Data: No additional findings.   Subjective: Chief Complaint  Patient presents with  . Left Leg - Pain    HPI Mrs. Kristen Johnson is well-known to Dr. Magnus Ivan service comes in today with left hip pain.  Points to the buttocks region  radiates down the lateral aspect of the leg.  She describes it as an achy soreness.  Is been ongoing for about 7 weeks no known injury.  She did do a lot of writing in the truck on the way down to [place] and was up and down out of a high pickup truck.  She notes she is having spasms down the left leg.  She has tried Advil and Tylenol without any real relief.  She is having no bowel or bladder dysfunction.  No saddle anesthesia like symptoms. She was last seen by Dr. Alvester Morin in February of this year and had repeat bilateral L3-L4 lumbar epidural steroid injections with good relief.  In the past she has had low back pain that radiated to the right side.  Occasionally lower lateral left leg.  She underwent an MRI of her lumbar spine 12/15/2019 which showed multifactorial L3-4 spinal stenosis moderate.  Encroachment upon the L3 nerve root due to a broad-based foraminal disc protrusion.  Multifactorial stenosis at L4-5.  Foraminal stenosis L2-3 mild to moderate facet hypertrophy L3-4 and L4-L5. Patient is nondiabetic. Review of Systems Negative for fevers chills shortness of breath.  No recent vaccines.  Objective: Vital Signs: There were no vitals taken for this visit.  Physical Exam General well-developed well-nourished female no acute  distress mood affect appropriate Ortho Exam Bilateral feet dorsal pedal pulses are present.  Sensation grossly intact bilateral feet.  Lower extremities 5 out of 5 strength throughout against resistance.  Negative straight leg raise bilaterally.  She has good range of motion lumbar spine with flexion and extension without pain.  Nontender over the lumbar lower paraspinous region and lumbar spinal column.  Good range of motion bilateral hips.  Tenderness left hip greater trochanteric region. Specialty Comments:  No specialty comments available.  Imaging: XR HIP UNILAT W OR W/O PELVIS 1V LEFT  Result Date: 11/10/2020 AP pelvis lateral view left hip: No acute fractures.   Bilateral hips well located.  Hip joints are well-maintained bilaterally.  XR Lumbar Spine 2-3 Views  Result Date: 11/10/2020 Lumbar spine 2 views: No acute fractures.  Grade 1 spondylolisthesis L4 on 5 with seen on prior radiographs.  Multilevel degenerative changes.  Slight scoliosis.    PMFS History: There are no problems to display for this patient.  Past Medical History:  Diagnosis Date  . Hypertension     History reviewed. No pertinent family history.  History reviewed. No pertinent surgical history. Social History   Occupational History  . Not on file  Tobacco Use  . Smoking status: Never Smoker  . Smokeless tobacco: Never Used  Substance and Sexual Activity  . Alcohol use: No  . Drug use: No  . Sexual activity: Not on file

## 2020-12-10 DIAGNOSIS — Z1231 Encounter for screening mammogram for malignant neoplasm of breast: Secondary | ICD-10-CM | POA: Diagnosis not present

## 2021-01-04 ENCOUNTER — Other Ambulatory Visit: Payer: Self-pay | Admitting: Physician Assistant

## 2021-01-10 DIAGNOSIS — R350 Frequency of micturition: Secondary | ICD-10-CM | POA: Diagnosis not present

## 2021-02-17 DIAGNOSIS — L918 Other hypertrophic disorders of the skin: Secondary | ICD-10-CM | POA: Diagnosis not present

## 2021-02-17 DIAGNOSIS — L570 Actinic keratosis: Secondary | ICD-10-CM | POA: Diagnosis not present

## 2021-02-17 DIAGNOSIS — L578 Other skin changes due to chronic exposure to nonionizing radiation: Secondary | ICD-10-CM | POA: Diagnosis not present

## 2021-02-17 DIAGNOSIS — L820 Inflamed seborrheic keratosis: Secondary | ICD-10-CM | POA: Diagnosis not present

## 2021-02-17 DIAGNOSIS — L814 Other melanin hyperpigmentation: Secondary | ICD-10-CM | POA: Diagnosis not present

## 2021-02-17 DIAGNOSIS — D1801 Hemangioma of skin and subcutaneous tissue: Secondary | ICD-10-CM | POA: Diagnosis not present

## 2021-02-17 DIAGNOSIS — L821 Other seborrheic keratosis: Secondary | ICD-10-CM | POA: Diagnosis not present

## 2021-03-01 DIAGNOSIS — N816 Rectocele: Secondary | ICD-10-CM | POA: Diagnosis not present

## 2021-03-01 DIAGNOSIS — N898 Other specified noninflammatory disorders of vagina: Secondary | ICD-10-CM | POA: Diagnosis not present

## 2021-03-02 ENCOUNTER — Other Ambulatory Visit: Payer: Self-pay | Admitting: Physician Assistant

## 2021-04-06 DIAGNOSIS — R1033 Periumbilical pain: Secondary | ICD-10-CM | POA: Diagnosis not present

## 2021-04-06 DIAGNOSIS — R1013 Epigastric pain: Secondary | ICD-10-CM | POA: Diagnosis not present

## 2021-04-06 DIAGNOSIS — Z800 Family history of malignant neoplasm of digestive organs: Secondary | ICD-10-CM | POA: Diagnosis not present

## 2021-04-06 DIAGNOSIS — Z23 Encounter for immunization: Secondary | ICD-10-CM | POA: Diagnosis not present

## 2021-04-11 ENCOUNTER — Other Ambulatory Visit: Payer: Self-pay | Admitting: Family Medicine

## 2021-04-11 DIAGNOSIS — R1033 Periumbilical pain: Secondary | ICD-10-CM

## 2021-04-12 ENCOUNTER — Ambulatory Visit: Payer: Medicare PPO | Attending: Internal Medicine

## 2021-04-12 ENCOUNTER — Other Ambulatory Visit (HOSPITAL_BASED_OUTPATIENT_CLINIC_OR_DEPARTMENT_OTHER): Payer: Self-pay

## 2021-04-12 DIAGNOSIS — Z23 Encounter for immunization: Secondary | ICD-10-CM

## 2021-04-12 MED ORDER — PFIZER COVID-19 VAC BIVALENT 30 MCG/0.3ML IM SUSP
INTRAMUSCULAR | 0 refills | Status: AC
  Filled 2021-04-12: qty 0.3, 1d supply, fill #0

## 2021-04-12 NOTE — Progress Notes (Signed)
   Covid-19 Vaccination Clinic  Name:  Kristen Johnson    MRN: [id number] DOB: 1946-03-11  04/12/2021  Kristen Johnson was observed post Covid-19 immunization for 15 minutes without incident. She was provided with Vaccine Information Sheet and instruction to access the V-Safe system.   Kristen Johnson was instructed to call 911 with any severe reactions post vaccine: Difficulty breathing  Swelling of face and throat  A fast heartbeat  A bad rash all over body  Dizziness and weakness

## 2021-04-27 ENCOUNTER — Ambulatory Visit
Admission: RE | Admit: 2021-04-27 | Discharge: 2021-04-27 | Disposition: A | Discharge status: Home / Self Care | Payer: Medicare PPO | Source: Ambulatory Visit | Attending: Family Medicine | Admitting: Family Medicine

## 2021-04-27 DIAGNOSIS — N3289 Other specified disorders of bladder: Secondary | ICD-10-CM | POA: Diagnosis not present

## 2021-04-27 DIAGNOSIS — R1033 Periumbilical pain: Secondary | ICD-10-CM

## 2021-04-27 DIAGNOSIS — N200 Calculus of kidney: Secondary | ICD-10-CM | POA: Diagnosis not present

## 2021-04-27 DIAGNOSIS — M5136 Other intervertebral disc degeneration, lumbar region: Secondary | ICD-10-CM | POA: Diagnosis not present

## 2021-04-27 DIAGNOSIS — M4186 Other forms of scoliosis, lumbar region: Secondary | ICD-10-CM | POA: Diagnosis not present

## 2021-04-27 MED ORDER — IOPAMIDOL (ISOVUE-300) INJECTION 61%
100.0000 mL | Freq: Once | INTRAVENOUS | Status: AC | PRN
  Administered 2021-04-27: 100 mL via INTRAVENOUS

## 2021-04-28 ENCOUNTER — Other Ambulatory Visit (HOSPITAL_COMMUNITY): Payer: Self-pay | Admitting: Family Medicine

## 2021-04-28 ENCOUNTER — Other Ambulatory Visit: Payer: Self-pay | Admitting: Family Medicine

## 2021-04-28 DIAGNOSIS — R222 Localized swelling, mass and lump, trunk: Secondary | ICD-10-CM

## 2021-04-28 DIAGNOSIS — R9389 Abnormal findings on diagnostic imaging of other specified body structures: Secondary | ICD-10-CM

## 2021-05-04 ENCOUNTER — Ambulatory Visit (HOSPITAL_COMMUNITY)
Admission: RE | Admit: 2021-05-04 | Discharge: 2021-05-04 | Disposition: A | Discharge status: Home / Self Care | Payer: Medicare PPO | Source: Ambulatory Visit | Attending: Family Medicine | Admitting: Family Medicine

## 2021-05-04 ENCOUNTER — Other Ambulatory Visit: Payer: Self-pay

## 2021-05-04 DIAGNOSIS — I700 Atherosclerosis of aorta: Secondary | ICD-10-CM | POA: Insufficient documentation

## 2021-05-04 DIAGNOSIS — N200 Calculus of kidney: Secondary | ICD-10-CM | POA: Diagnosis not present

## 2021-05-04 DIAGNOSIS — R9389 Abnormal findings on diagnostic imaging of other specified body structures: Secondary | ICD-10-CM

## 2021-05-04 DIAGNOSIS — R918 Other nonspecific abnormal finding of lung field: Secondary | ICD-10-CM | POA: Diagnosis not present

## 2021-05-04 DIAGNOSIS — I2510 Atherosclerotic heart disease of native coronary artery without angina pectoris: Secondary | ICD-10-CM | POA: Insufficient documentation

## 2021-05-04 DIAGNOSIS — R222 Localized swelling, mass and lump, trunk: Secondary | ICD-10-CM | POA: Insufficient documentation

## 2021-05-04 LAB — GLUCOSE, CAPILLARY: Glucose-Capillary: 110 mg/dL — ABNORMAL HIGH (ref 70–99)

## 2021-05-04 MED ORDER — FLUDEOXYGLUCOSE F - 18 (FDG) INJECTION
9.6900 | Freq: Once | INTRAVENOUS | Status: AC
  Administered 2021-05-04: 9.69 via INTRAVENOUS

## 2021-05-10 DIAGNOSIS — Z23 Encounter for immunization: Secondary | ICD-10-CM | POA: Diagnosis not present

## 2021-05-10 DIAGNOSIS — Z0000 Encounter for general adult medical examination without abnormal findings: Secondary | ICD-10-CM | POA: Diagnosis not present

## 2021-05-10 DIAGNOSIS — Z1389 Encounter for screening for other disorder: Secondary | ICD-10-CM | POA: Diagnosis not present

## 2021-05-17 DIAGNOSIS — R9389 Abnormal findings on diagnostic imaging of other specified body structures: Secondary | ICD-10-CM | POA: Diagnosis not present

## 2021-05-17 DIAGNOSIS — I700 Atherosclerosis of aorta: Secondary | ICD-10-CM | POA: Diagnosis not present

## 2021-06-08 DIAGNOSIS — H43393 Other vitreous opacities, bilateral: Secondary | ICD-10-CM | POA: Diagnosis not present

## 2021-06-08 DIAGNOSIS — H10413 Chronic giant papillary conjunctivitis, bilateral: Secondary | ICD-10-CM | POA: Diagnosis not present

## 2021-06-08 DIAGNOSIS — Z961 Presence of intraocular lens: Secondary | ICD-10-CM | POA: Diagnosis not present

## 2021-06-08 DIAGNOSIS — H43812 Vitreous degeneration, left eye: Secondary | ICD-10-CM | POA: Diagnosis not present

## 2021-06-08 DIAGNOSIS — H31092 Other chorioretinal scars, left eye: Secondary | ICD-10-CM | POA: Diagnosis not present

## 2021-06-08 DIAGNOSIS — H04123 Dry eye syndrome of bilateral lacrimal glands: Secondary | ICD-10-CM | POA: Diagnosis not present

## 2021-07-20 DIAGNOSIS — Z6837 Body mass index (BMI) 37.0-37.9, adult: Secondary | ICD-10-CM | POA: Diagnosis not present

## 2021-07-20 DIAGNOSIS — J019 Acute sinusitis, unspecified: Secondary | ICD-10-CM | POA: Diagnosis not present

## 2021-08-08 DIAGNOSIS — H6122 Impacted cerumen, left ear: Secondary | ICD-10-CM | POA: Diagnosis not present

## 2021-08-08 DIAGNOSIS — H938X2 Other specified disorders of left ear: Secondary | ICD-10-CM | POA: Diagnosis not present

## 2021-09-01 ENCOUNTER — Other Ambulatory Visit: Payer: Self-pay | Admitting: Family Medicine

## 2021-09-01 DIAGNOSIS — R9389 Abnormal findings on diagnostic imaging of other specified body structures: Secondary | ICD-10-CM

## 2021-09-19 ENCOUNTER — Other Ambulatory Visit: Payer: Self-pay

## 2021-09-19 ENCOUNTER — Ambulatory Visit
Admission: RE | Admit: 2021-09-19 | Discharge: 2021-09-19 | Disposition: A | Discharge status: Home / Self Care | Payer: Medicare PPO | Source: Ambulatory Visit | Attending: Family Medicine | Admitting: Family Medicine

## 2021-09-19 DIAGNOSIS — K449 Diaphragmatic hernia without obstruction or gangrene: Secondary | ICD-10-CM | POA: Diagnosis not present

## 2021-09-19 DIAGNOSIS — I2510 Atherosclerotic heart disease of native coronary artery without angina pectoris: Secondary | ICD-10-CM | POA: Diagnosis not present

## 2021-09-19 DIAGNOSIS — R9389 Abnormal findings on diagnostic imaging of other specified body structures: Secondary | ICD-10-CM

## 2021-09-19 DIAGNOSIS — I10 Essential (primary) hypertension: Secondary | ICD-10-CM | POA: Diagnosis not present

## 2021-09-19 DIAGNOSIS — J984 Other disorders of lung: Secondary | ICD-10-CM | POA: Diagnosis not present

## 2021-09-22 DIAGNOSIS — I700 Atherosclerosis of aorta: Secondary | ICD-10-CM | POA: Diagnosis not present

## 2021-11-07 DIAGNOSIS — Z6836 Body mass index (BMI) 36.0-36.9, adult: Secondary | ICD-10-CM | POA: Diagnosis not present

## 2021-11-07 DIAGNOSIS — R7309 Other abnormal glucose: Secondary | ICD-10-CM | POA: Diagnosis not present

## 2021-11-07 DIAGNOSIS — F411 Generalized anxiety disorder: Secondary | ICD-10-CM | POA: Diagnosis not present

## 2021-11-07 DIAGNOSIS — I10 Essential (primary) hypertension: Secondary | ICD-10-CM | POA: Diagnosis not present

## 2021-11-07 DIAGNOSIS — E7800 Pure hypercholesterolemia, unspecified: Secondary | ICD-10-CM | POA: Diagnosis not present

## 2021-11-18 DIAGNOSIS — H903 Sensorineural hearing loss, bilateral: Secondary | ICD-10-CM | POA: Diagnosis not present

## 2021-11-18 DIAGNOSIS — H6982 Other specified disorders of Eustachian tube, left ear: Secondary | ICD-10-CM | POA: Diagnosis not present

## 2021-11-18 DIAGNOSIS — J309 Allergic rhinitis, unspecified: Secondary | ICD-10-CM | POA: Diagnosis not present

## 2022-04-12 ENCOUNTER — Other Ambulatory Visit: Payer: Self-pay | Admitting: Family Medicine

## 2022-04-12 DIAGNOSIS — R9389 Abnormal findings on diagnostic imaging of other specified body structures: Secondary | ICD-10-CM

## 2022-05-11 ENCOUNTER — Ambulatory Visit
Admission: RE | Admit: 2022-05-11 | Discharge: 2022-05-11 | Disposition: A | Discharge status: Home / Self Care | Payer: Medicare PPO | Source: Ambulatory Visit | Attending: Family Medicine | Admitting: Family Medicine

## 2022-05-11 DIAGNOSIS — R9389 Abnormal findings on diagnostic imaging of other specified body structures: Secondary | ICD-10-CM

## 2022-11-09 DIAGNOSIS — I10 Essential (primary) hypertension: Secondary | ICD-10-CM | POA: Diagnosis not present

## 2022-11-09 DIAGNOSIS — F419 Anxiety disorder, unspecified: Secondary | ICD-10-CM | POA: Diagnosis not present

## 2022-11-09 DIAGNOSIS — I700 Atherosclerosis of aorta: Secondary | ICD-10-CM | POA: Diagnosis not present

## 2022-11-09 DIAGNOSIS — Z6838 Body mass index (BMI) 38.0-38.9, adult: Secondary | ICD-10-CM | POA: Diagnosis not present

## 2022-12-06 DIAGNOSIS — L720 Epidermal cyst: Secondary | ICD-10-CM | POA: Diagnosis not present

## 2022-12-06 DIAGNOSIS — L570 Actinic keratosis: Secondary | ICD-10-CM | POA: Diagnosis not present

## 2022-12-06 DIAGNOSIS — L820 Inflamed seborrheic keratosis: Secondary | ICD-10-CM | POA: Diagnosis not present

## 2022-12-18 DIAGNOSIS — Z1231 Encounter for screening mammogram for malignant neoplasm of breast: Secondary | ICD-10-CM | POA: Diagnosis not present

## 2023-04-27 DIAGNOSIS — Z23 Encounter for immunization: Secondary | ICD-10-CM | POA: Diagnosis not present

## 2023-05-04 DIAGNOSIS — L820 Inflamed seborrheic keratosis: Secondary | ICD-10-CM | POA: Diagnosis not present

## 2023-05-04 DIAGNOSIS — L570 Actinic keratosis: Secondary | ICD-10-CM | POA: Diagnosis not present

## 2023-05-04 DIAGNOSIS — L814 Other melanin hyperpigmentation: Secondary | ICD-10-CM | POA: Diagnosis not present

## 2023-05-04 DIAGNOSIS — L821 Other seborrheic keratosis: Secondary | ICD-10-CM | POA: Diagnosis not present

## 2023-05-04 DIAGNOSIS — L578 Other skin changes due to chronic exposure to nonionizing radiation: Secondary | ICD-10-CM | POA: Diagnosis not present

## 2023-05-04 DIAGNOSIS — D1801 Hemangioma of skin and subcutaneous tissue: Secondary | ICD-10-CM | POA: Diagnosis not present

## 2023-05-22 DIAGNOSIS — I10 Essential (primary) hypertension: Secondary | ICD-10-CM | POA: Diagnosis not present

## 2023-05-22 DIAGNOSIS — I700 Atherosclerosis of aorta: Secondary | ICD-10-CM | POA: Diagnosis not present

## 2023-05-22 DIAGNOSIS — R9389 Abnormal findings on diagnostic imaging of other specified body structures: Secondary | ICD-10-CM | POA: Diagnosis not present

## 2023-05-22 DIAGNOSIS — Z0000 Encounter for general adult medical examination without abnormal findings: Secondary | ICD-10-CM | POA: Diagnosis not present

## 2023-05-22 DIAGNOSIS — F411 Generalized anxiety disorder: Secondary | ICD-10-CM | POA: Diagnosis not present

## 2023-05-23 DIAGNOSIS — H9193 Unspecified hearing loss, bilateral: Secondary | ICD-10-CM | POA: Diagnosis not present

## 2023-05-23 DIAGNOSIS — K119 Disease of salivary gland, unspecified: Secondary | ICD-10-CM | POA: Diagnosis not present

## 2023-05-23 DIAGNOSIS — H903 Sensorineural hearing loss, bilateral: Secondary | ICD-10-CM | POA: Diagnosis not present

## 2023-06-26 DIAGNOSIS — H04123 Dry eye syndrome of bilateral lacrimal glands: Secondary | ICD-10-CM | POA: Diagnosis not present

## 2023-06-26 DIAGNOSIS — H43393 Other vitreous opacities, bilateral: Secondary | ICD-10-CM | POA: Diagnosis not present

## 2023-06-26 DIAGNOSIS — H10413 Chronic giant papillary conjunctivitis, bilateral: Secondary | ICD-10-CM | POA: Diagnosis not present

## 2023-06-26 DIAGNOSIS — H31092 Other chorioretinal scars, left eye: Secondary | ICD-10-CM | POA: Diagnosis not present

## 2023-09-18 IMAGING — PT NM PET TUM IMG INITIAL (PI) SKULL BASE T - THIGH
7 series · 25 of 25 positions shown · non-contrast
Comparison: CT AP 04/27/2021

CLINICAL DATA: Initial treatment strategy for lung mass.

EXAM:
NUCLEAR MEDICINE PET SKULL BASE TO THIGH
TECHNIQUE: 9.69 mCi F-18 FDG was injected intravenously. Full-ring PET imaging
was performed from the skull base to thigh after the radiotracer. CT
data was obtained and used for attenuation correction and anatomic
localization.
Fasting blood glucose: 110 mg/dl

[Series 3: pet sk_thigh ac · axial · 5.0mm · 4.07mm/px · z∈[-1386,-510]mm · 6 of 220 slices shown]
[im 1/220]
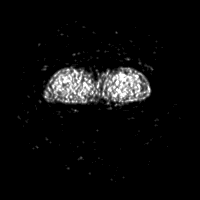
[im 44/220]
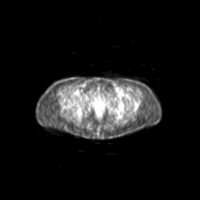
[im 88/220]
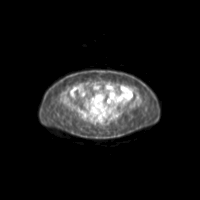
[im 132/220]
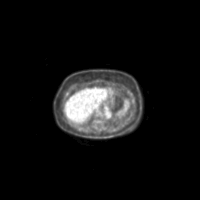
[im 176/220]
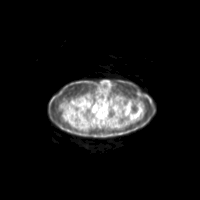
[im 220/220]
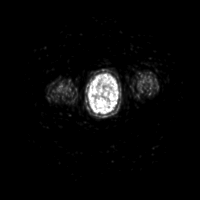

[Series 4: ct sk_thigh 5.0 bf37 · axial · 5.0mm · 0.98mm/px · z∈[-1386,-510]mm · 5 of 220 slices shown]
[im 1/220]
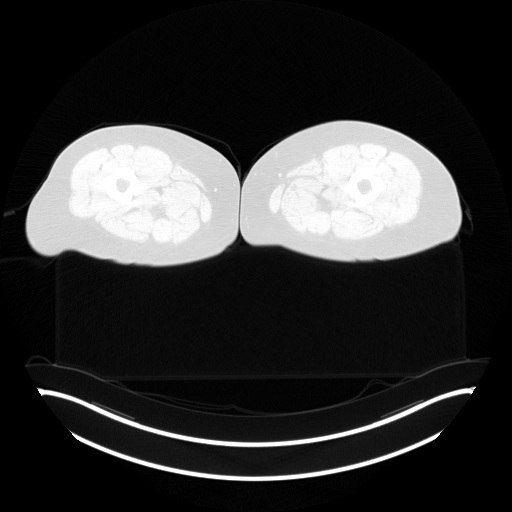
[im 55/220]
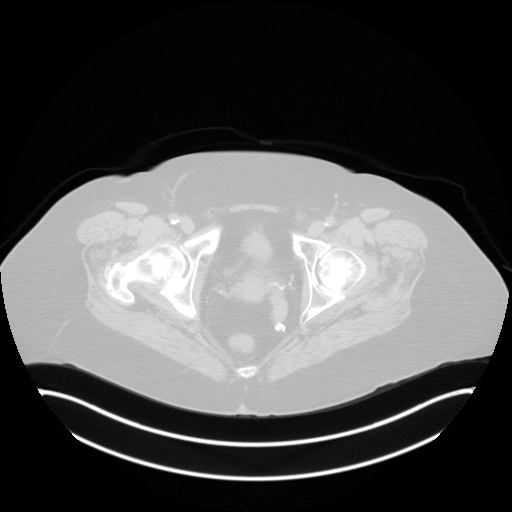
[im 110/220]
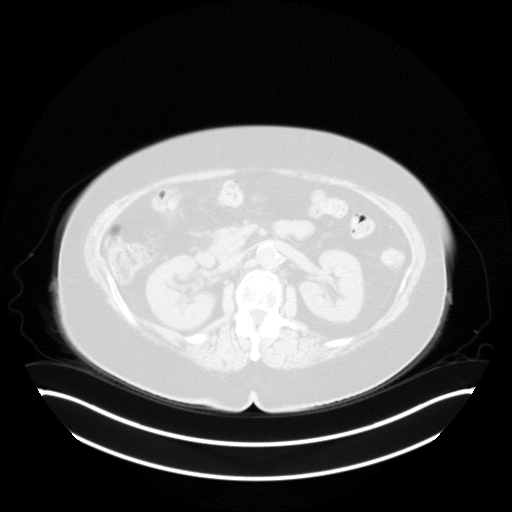
[im 165/220]
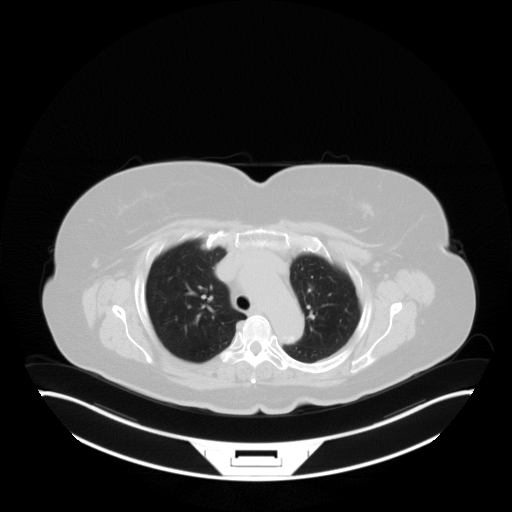
[im 220/220  brain]
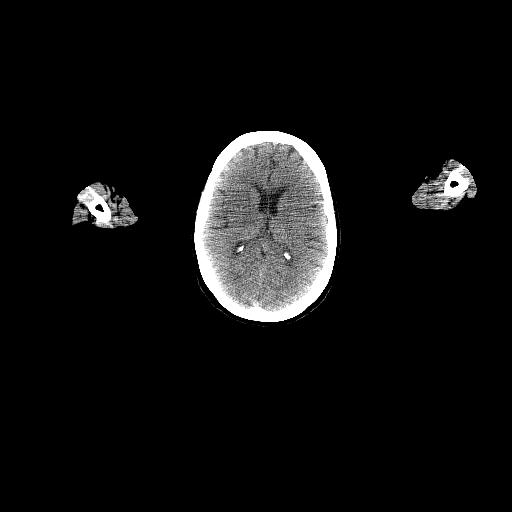

[Series 5: pet sk_thigh nac · axial · 5.0mm · 4.07mm/px · z∈[-1386,-510]mm · 5 of 220 slices shown]
[im 1/220]
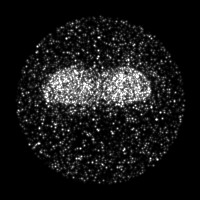
[im 55/220]
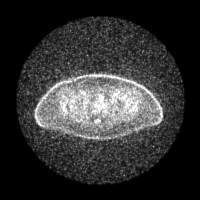
[im 110/220]
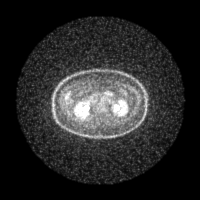
[im 165/220]
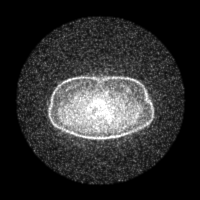
[im 220/220]
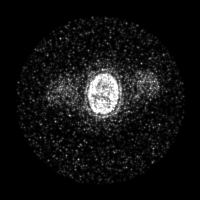

[Series 8: ct sk_thigh 5.0 br59 lung_bone · axial · 5.0mm · 0.68mm/px · z∈[-922,-654]mm · 2 of 68 slices shown]
[im 1/68]
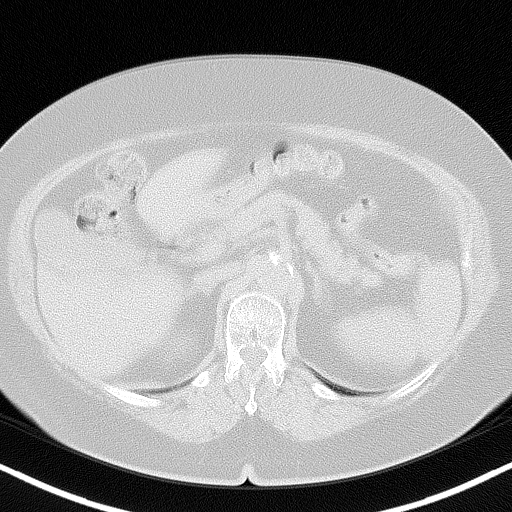
[im 68/68  brain]
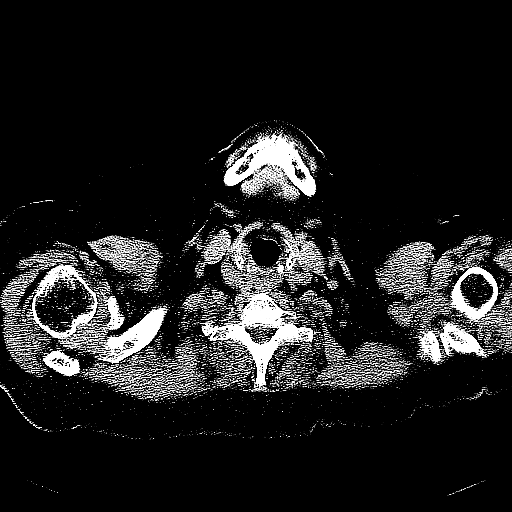

[Series 603: fused cor · 1 of 49 slices shown]
[im 1/49]
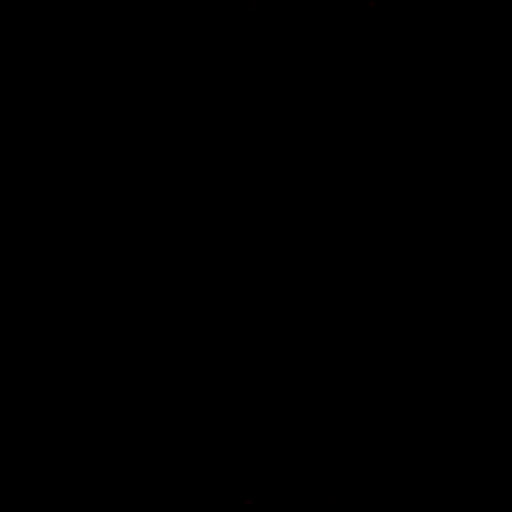

[Series 604: <mip collection> · coronal · 1.82mm/px · 1 of 32 slices shown]
[im 1/32]
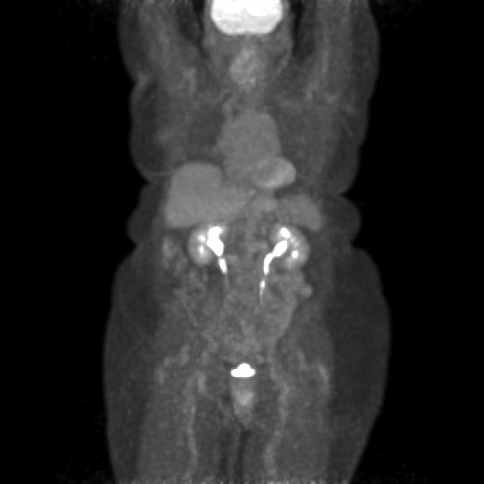

[Series 605: range-ct sk_thigh 5.0 bf37-tra-<alpha range> · 5 of 215 slices shown]
[im 1/215]
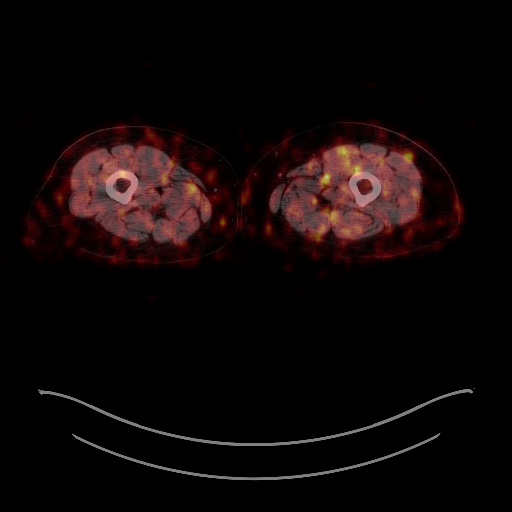
[im 54/215]
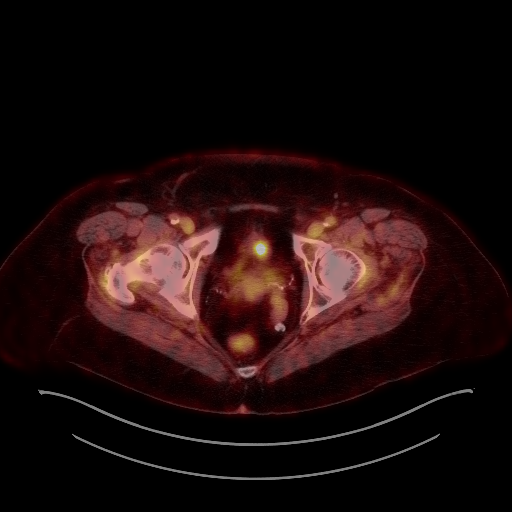
[im 108/215]
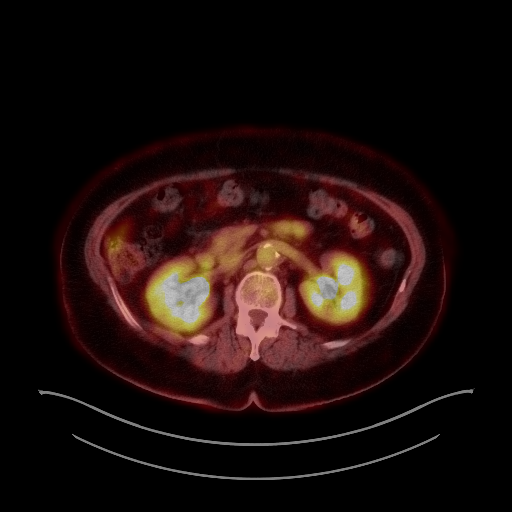
[im 161/215]
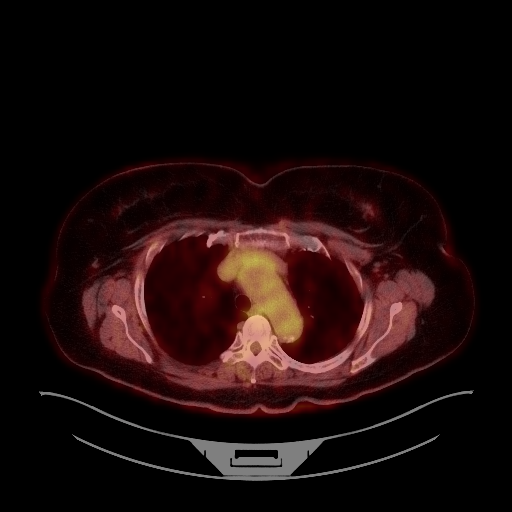
[im 215/215]
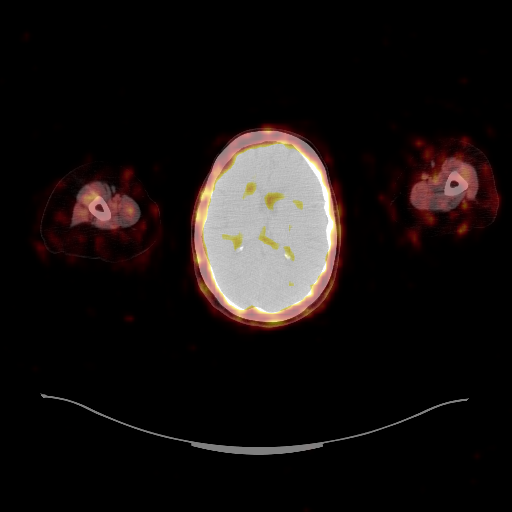

[25 of 25 positions shown; findings below may reference images not displayed]

FINDINGS: Mediastinal blood pool activity: SUV max

Liver activity: SUV max NA

NECK: No hypermetabolic lymph nodes in the neck.

Incidental CT findings: none

CHEST: The subpleural mass within the paramediastinal right lower
lobe measures 2.8 by 1.5 cm without significant FDG uptake. SUV max
is measured at 0.9 to 1.5, image 82/4.

No additional pulmonary nodule or mass identified. No FDG avid
axillary, supraclavicular, mediastinal, or hilar lymph nodes.

Incidental CT findings: Aortic atherosclerosis. Coronary artery
calcifications.

ABDOMEN/PELVIS: There is no abnormal FDG uptake within the liver,
pancreas, spleen, or adrenal glands. No FDG avid abdominopelvic
lymph nodes.

Incidental CT findings: Aortic atherosclerosis. Bilateral
nonobstructing renal calculi.

SKELETON: No focal hypermetabolic activity to suggest skeletal
metastasis.

Incidental CT findings: none
IMPRESSION: 1. There is no significant FDG uptake associated with the subpleural
mass in the paramediastinal right lower lobe. This favors a benign
process such as pulmonary sequestration. As certain low-grade,
indolent pulmonary neoplasms may exhibit very low level FDG uptake
(specifically pulmonary adenocarcinoma) a follow-up CT of the chest
in 3 months is recommend.
2. Aortic Atherosclerosis ([id number].I). Coronary artery
calcifications.

## 2023-12-07 DIAGNOSIS — E785 Hyperlipidemia, unspecified: Secondary | ICD-10-CM | POA: Diagnosis not present

## 2023-12-07 DIAGNOSIS — R918 Other nonspecific abnormal finding of lung field: Secondary | ICD-10-CM | POA: Diagnosis not present

## 2023-12-07 DIAGNOSIS — R7303 Prediabetes: Secondary | ICD-10-CM | POA: Diagnosis not present

## 2023-12-07 DIAGNOSIS — I10 Essential (primary) hypertension: Secondary | ICD-10-CM | POA: Diagnosis not present

## 2023-12-07 DIAGNOSIS — F419 Anxiety disorder, unspecified: Secondary | ICD-10-CM | POA: Diagnosis not present

## 2023-12-24 ENCOUNTER — Other Ambulatory Visit: Payer: Self-pay | Admitting: Family Medicine

## 2023-12-24 DIAGNOSIS — Z1231 Encounter for screening mammogram for malignant neoplasm of breast: Secondary | ICD-10-CM | POA: Diagnosis not present

## 2023-12-24 DIAGNOSIS — R918 Other nonspecific abnormal finding of lung field: Secondary | ICD-10-CM

## 2024-01-02 ENCOUNTER — Ambulatory Visit
Admission: RE | Admit: 2024-01-02 | Discharge: 2024-01-02 | Disposition: A | Discharge status: Home / Self Care | Source: Ambulatory Visit | Attending: Family Medicine

## 2024-01-02 DIAGNOSIS — I2510 Atherosclerotic heart disease of native coronary artery without angina pectoris: Secondary | ICD-10-CM | POA: Diagnosis not present

## 2024-01-02 DIAGNOSIS — R918 Other nonspecific abnormal finding of lung field: Secondary | ICD-10-CM

## 2024-04-28 DIAGNOSIS — Z23 Encounter for immunization: Secondary | ICD-10-CM | POA: Diagnosis not present

## 2024-05-08 DIAGNOSIS — L578 Other skin changes due to chronic exposure to nonionizing radiation: Secondary | ICD-10-CM | POA: Diagnosis not present

## 2024-05-08 DIAGNOSIS — L820 Inflamed seborrheic keratosis: Secondary | ICD-10-CM | POA: Diagnosis not present

## 2024-05-08 DIAGNOSIS — L821 Other seborrheic keratosis: Secondary | ICD-10-CM | POA: Diagnosis not present

## 2024-05-08 DIAGNOSIS — L570 Actinic keratosis: Secondary | ICD-10-CM | POA: Diagnosis not present

## 2024-05-08 DIAGNOSIS — D1801 Hemangioma of skin and subcutaneous tissue: Secondary | ICD-10-CM | POA: Diagnosis not present

## 2024-05-08 DIAGNOSIS — L814 Other melanin hyperpigmentation: Secondary | ICD-10-CM | POA: Diagnosis not present

## 2024-05-08 DIAGNOSIS — L720 Epidermal cyst: Secondary | ICD-10-CM | POA: Diagnosis not present
